# Patient Record
Sex: Female | Born: 1970 | Race: Black or African American | Hispanic: No | Marital: Married | Smoking: Current every day smoker
Health system: Southern US, Community
[De-identification: ages and names within clinical notes are randomized; demographics above are authoritative.]

## PROBLEM LIST (undated history)

## (undated) DIAGNOSIS — M549 Dorsalgia, unspecified: Secondary | ICD-10-CM

## (undated) HISTORY — PX: OTHER SURGICAL HISTORY: SHX169

## (undated) HISTORY — PX: KNEE SURGERY: SHX244

## (undated) HISTORY — PX: TOE SURGERY: SHX1073

## (undated) HISTORY — PX: FINGER SURGERY: SHX640

---

## 2015-12-20 ENCOUNTER — Ambulatory Visit (HOSPITAL_COMMUNITY): Payer: Medicaid Other | Attending: Internal Medicine

## 2015-12-20 ENCOUNTER — Encounter (HOSPITAL_COMMUNITY): Payer: Self-pay | Admitting: Emergency Medicine

## 2015-12-20 ENCOUNTER — Emergency Department (HOSPITAL_COMMUNITY)
Admission: EM | Admit: 2015-12-20 | Discharge: 2015-12-20 | Disposition: A | Discharge status: Home / Self Care | Payer: Medicaid Other | Attending: Emergency Medicine | Admitting: Emergency Medicine

## 2015-12-20 DIAGNOSIS — M549 Dorsalgia, unspecified: Secondary | ICD-10-CM | POA: Diagnosis present

## 2015-12-20 DIAGNOSIS — F17210 Nicotine dependence, cigarettes, uncomplicated: Secondary | ICD-10-CM | POA: Diagnosis not present

## 2015-12-20 DIAGNOSIS — R2689 Other abnormalities of gait and mobility: Secondary | ICD-10-CM | POA: Insufficient documentation

## 2015-12-20 DIAGNOSIS — M5441 Lumbago with sciatica, right side: Secondary | ICD-10-CM | POA: Insufficient documentation

## 2015-12-20 DIAGNOSIS — M5431 Sciatica, right side: Secondary | ICD-10-CM

## 2015-12-20 DIAGNOSIS — M62830 Muscle spasm of back: Secondary | ICD-10-CM | POA: Insufficient documentation

## 2015-12-20 HISTORY — DX: Dorsalgia, unspecified: M54.9

## 2015-12-20 MED ORDER — DEXAMETHASONE SODIUM PHOSPHATE 10 MG/ML IJ SOLN
10.0000 mg | Freq: Once | INTRAMUSCULAR | Status: AC
  Administered 2015-12-20: 10 mg via INTRAMUSCULAR
  Filled 2015-12-20: qty 1

## 2015-12-20 MED ORDER — HYDROCODONE-ACETAMINOPHEN 5-325 MG PO TABS: 1.0000 | ORAL_TABLET | Freq: Four times a day (QID) | ORAL | Status: DC | PRN

## 2015-12-20 MED ORDER — KETOROLAC TROMETHAMINE 60 MG/2ML IM SOLN
60.0000 mg | Freq: Once | INTRAMUSCULAR | Status: AC
  Administered 2015-12-20: 60 mg via INTRAMUSCULAR
  Filled 2015-12-20: qty 2

## 2015-12-20 MED ORDER — OXYCODONE-ACETAMINOPHEN 5-325 MG PO TABS
2.0000 | ORAL_TABLET | Freq: Once | ORAL | Status: AC
Start: 2015-12-20 — End: 2015-12-20
  Administered 2015-12-20: 2 via ORAL
  Filled 2015-12-20: qty 2

## 2015-12-20 MED ORDER — ONDANSETRON HCL 4 MG PO TABS
4.0000 mg | ORAL_TABLET | Freq: Once | ORAL | Status: AC
  Administered 2015-12-20: 4 mg via ORAL
  Filled 2015-12-20: qty 1

## 2015-12-20 NOTE — ED Provider Notes (Signed)
CSN: [id number]     Arrival date & time 12/20/15  1042 History   First MD Initiated Contact with Patient 12/20/15 1059     Chief Complaint  Patient presents with  . Back Pain     (Consider location/radiation/quality/duration/timing/severity/associated sxs/prior Treatment) HPI   45 year old female who presents emergency probably chief complaint of right lower back and leg pain. She has a past medical history of chronic back pain. Patient was at a rehabilitation center getting physical therapy this morning. She came to the emergency department for evaluation as severe, shooting pain down the back part of her right leg. She states she has a history of back pain and aching. However, this is the worst pain. She's ever had. She states that it is shooting from the back all the way down to behind her knee. It is worse when standing upright. She is pain with ambulation. She is currently taking Lyrica for pain control. She denies any recent injuries.Denies weakness, loss of bowel/bladder function or saddle anesthesia. Denies neck stiffness, headache, rash.  Denies fever or recent procedures to back.   Past Medical History  Diagnosis Date  . Back pain    Past Surgical History  Procedure Laterality Date  . Toe surgery    . Knee surgery    . Finger surgery     No family history on file. Social History  Substance Use Topics  . Smoking status: Current Every Day Smoker -- 0.50 packs/day    Types: Cigarettes  . Smokeless tobacco: None  . Alcohol Use: No   OB History    No data available     Review of Systems  Ten systems reviewed and are negative for acute change, except as noted in the HPI.    Allergies  Penicillins  Home Medications   Prior to Admission medications   Medication Sig Start Date End Date Taking? Authorizing Provider  HYDROcodone-acetaminophen (NORCO) 5-325 MG tablet Take 1-2 tablets by mouth every 6 (six) hours as needed for moderate pain. 12/20/15   Lilyonna Steidle,  PA-C   BP 160/86 mmHg  Pulse 72  Temp(Src) 98.2 F (36.8 C) (Oral)  Resp 18  Ht 5\' 9"  (1.753 m)  Wt 101.606 kg  BMI 33.06 kg/m2  SpO2 100%  LMP 11/26/2015 Physical Exam  Constitutional: She is oriented to person, place, and time. She appears well-developed and well-nourished. No distress.  HENT:  Head: Normocephalic and atraumatic.  Eyes: Conjunctivae are normal. No scleral icterus.  Neck: Normal range of motion.  Cardiovascular: Normal rate, regular rhythm and normal heart sounds.  Exam reveals no gallop and no friction rub.   No murmur heard. Pulmonary/Chest: Effort normal and breath sounds normal. No respiratory distress.  Abdominal: Soft. Bowel sounds are normal. She exhibits no distension and no mass. There is no tenderness. There is no guarding.  Musculoskeletal:  7  Patient appears to be in mild to moderate pain, antalgic gait noted. Lumbosacral spine area reveals no local tenderness or mass.  Painful and reduced LS ROM noted. Straight leg raise is positive at 10 degrees on right. DTR's, motor strength and sensation normal, including heel and toe gait.  Peripheral pulses are palpable. X-Ray: not indicated.     Neurological: She is alert and oriented to person, place, and time.  Skin: Skin is warm and dry. She is not diaphoretic.    ED Course  Procedures (including critical care time) Labs Review Labs Reviewed - No data to display  Imaging Review No results found.  I have personally reviewed and evaluated these images and lab results as part of my medical decision-making.   EKG Interpretation None      MDM   Final diagnoses:  Sciatica of right side    Patient with back pain.  No neurological deficits and normal neuro exam.  Patient can walk but states is painful.  No loss of bowel or bladder control.  No concern for cauda equina.  No fever, night sweats, weight loss, h/o cancer, IVDU.  RICE protocol and pain medicine indicated and discussed with patient.       Arthor Captainbigail Caidance Sybert, PA-C 12/20/15 1721  Glynn OctaveStephen Rancour, MD 12/20/15 [phone]

## 2015-12-20 NOTE — ED Notes (Signed)
Social worker contacted about transportation home. Social worker explained that patient could call [facility] to get home as she has Medicaid.

## 2015-12-20 NOTE — ED Notes (Signed)
Pt c/o RT lower back pain that radiates to RT leg. Denies recent injury or fall. States she was at rehab today for back/leg and was unable to complete it due to pain. Denies GI/GU symptoms.

## 2015-12-20 NOTE — Care Management (Signed)
CM consult received for transportation issues. Pt has medicaid and instructed to contact [facility] for transport home.

## 2015-12-20 NOTE — ED Notes (Signed)
Pt able to find a ride. Case Management aware.
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
## 2015-12-20 NOTE — Discharge Instructions (Signed)
SEEK IMMEDIATE MEDICAL ATTENTION IF: °New numbness, tingling, weakness, or problem with the use of your arms or legs.  °Severe back pain not relieved with medications.  °Change in bowel or bladder control.  °Increasing pain in any areas of the body (such as chest or abdominal pain).  °Shortness of breath, dizziness or fainting.  °Nausea (feeling sick to your stomach), vomiting, fever, or sweats. ° ° °Sciatica °Sciatica is pain, weakness, numbness, or tingling along the path of the sciatic nerve. The nerve starts in the lower back and runs down the back of each leg. The nerve controls the muscles in the lower leg and in the back of the knee, while also providing sensation to the back of the thigh, lower leg, and the sole of your foot. Sciatica is a symptom of another medical condition. For instance, nerve damage or certain conditions, such as a herniated disk or bone spur on the spine, pinch or put pressure on the sciatic nerve. This causes the pain, weakness, or other sensations normally associated with sciatica. Generally, sciatica only affects one side of the body. °CAUSES  °· Herniated or slipped disc. °· Degenerative disk disease. °· A pain disorder involving the narrow muscle in the buttocks (piriformis syndrome). °· Pelvic injury or fracture. °· Pregnancy. °· Tumor (rare). °SYMPTOMS  °Symptoms can vary from mild to very severe. The symptoms usually travel from the low back to the buttocks and down the back of the leg. Symptoms can include: °· Mild tingling or dull aches in the lower back, leg, or hip. °· Numbness in the back of the calf or sole of the foot. °· Burning sensations in the lower back, leg, or hip. °· Sharp pains in the lower back, leg, or hip. °· Leg weakness. °· Severe back pain inhibiting movement. °These symptoms may get worse with coughing, sneezing, laughing, or prolonged sitting or standing. Also, being overweight may worsen symptoms. °DIAGNOSIS  °Your caregiver will perform a physical exam  to look for common symptoms of sciatica. He or she may ask you to do certain movements or activities that would trigger sciatic nerve pain. Other tests may be performed to find the cause of the sciatica. These may include: °· Blood tests. °· X-rays. °· Imaging tests, such as an MRI or CT scan. °TREATMENT  °Treatment is directed at the cause of the sciatic pain. Sometimes, treatment is not necessary and the pain and discomfort goes away on its own. If treatment is needed, your caregiver may suggest: °· Over-the-counter medicines to relieve pain. °· Prescription medicines, such as anti-inflammatory medicine, muscle relaxants, or narcotics. °· Applying heat or ice to the painful area. °· Steroid injections to lessen pain, irritation, and inflammation around the nerve. °· Reducing activity during periods of pain. °· Exercising and stretching to strengthen your abdomen and improve flexibility of your spine. Your caregiver may suggest losing weight if the extra weight makes the back pain worse. °· Physical therapy. °· Surgery to eliminate what is pressing or pinching the nerve, such as a bone spur or part of a herniated disk. °HOME CARE INSTRUCTIONS  °· Only take over-the-counter or prescription medicines for pain or discomfort as directed by your caregiver. °· Apply ice to the affected area for 20 minutes, 3-4 times a day for the first 48-72 hours. Then try heat in the same way. °· Exercise, stretch, or perform your usual activities if these do not aggravate your pain. °· Attend physical therapy sessions as directed by your caregiver. °· Keep   all follow-up appointments as directed by your caregiver. °· Do not wear high heels or shoes that do not provide proper support. °· Check your mattress to see if it is too soft. A firm mattress may lessen your pain and discomfort. °SEEK IMMEDIATE MEDICAL CARE IF:  °· You lose control of your bowel or bladder (incontinence). °· You have increasing weakness in the lower back, pelvis,  buttocks, or legs. °· You have redness or swelling of your back. °· You have a burning sensation when you urinate. °· You have pain that gets worse when you lie down or awakens you at night. °· Your pain is worse than you have experienced in the past. °· Your pain is lasting longer than 4 weeks. °· You are suddenly losing weight without reason. °MAKE SURE YOU: °· Understand these instructions. °· Will watch your condition. °· Will get help right away if you are not doing well or get worse. °  °This information is not intended to replace advice given to you by your health care provider. Make sure you discuss any questions you have with your health care provider. °  °Document Released: 06/04/2001 Document Revised: 03/01/2015 Document Reviewed: 10/20/2011 °Elsevier Interactive Patient Education ©2016 [employer] Inc. ° °

## 2015-12-20 NOTE — ED Notes (Signed)
Patient is tearful in room. She states that she does not have a way back home. Patient states transportation service that dropped off patient is unable to wait for patient to be seen and can not wait to transport patient home.

## 2015-12-31 ENCOUNTER — Encounter (HOSPITAL_COMMUNITY): Payer: Self-pay | Admitting: Emergency Medicine

## 2015-12-31 ENCOUNTER — Emergency Department (HOSPITAL_COMMUNITY)
Admission: EM | Admit: 2015-12-31 | Discharge: 2015-12-31 | Disposition: A | Discharge status: Home / Self Care | Payer: Medicaid Other | Attending: Emergency Medicine | Admitting: Emergency Medicine

## 2015-12-31 DIAGNOSIS — M545 Low back pain: Secondary | ICD-10-CM | POA: Diagnosis present

## 2015-12-31 DIAGNOSIS — F17210 Nicotine dependence, cigarettes, uncomplicated: Secondary | ICD-10-CM | POA: Insufficient documentation

## 2015-12-31 DIAGNOSIS — M5440 Lumbago with sciatica, unspecified side: Secondary | ICD-10-CM | POA: Diagnosis not present

## 2015-12-31 MED ORDER — OXYCODONE-ACETAMINOPHEN 5-325 MG PO TABS: 2.0000 | ORAL_TABLET | ORAL | Status: DC | PRN

## 2015-12-31 MED ORDER — OXYCODONE-ACETAMINOPHEN 5-325 MG PO TABS
2.0000 | ORAL_TABLET | Freq: Once | ORAL | Status: AC
  Administered 2015-12-31: 2 via ORAL
  Filled 2015-12-31: qty 2

## 2015-12-31 MED ORDER — ONDANSETRON 4 MG PO TBDP
4.0000 mg | ORAL_TABLET | Freq: Once | ORAL | Status: AC
  Administered 2015-12-31: 4 mg via ORAL
  Filled 2015-12-31: qty 1

## 2015-12-31 MED ORDER — KETOROLAC TROMETHAMINE 60 MG/2ML IM SOLN
60.0000 mg | Freq: Once | INTRAMUSCULAR | Status: AC
  Administered 2015-12-31: 60 mg via INTRAMUSCULAR
  Filled 2015-12-31: qty 2

## 2015-12-31 MED ORDER — OXYCODONE-ACETAMINOPHEN 5-325 MG PO TABS: 2.0000 | ORAL_TABLET | ORAL | Status: AC | PRN

## 2015-12-31 NOTE — Discharge Instructions (Signed)

## 2015-12-31 NOTE — ED Provider Notes (Signed)
CSN: [id number]     Arrival date & time 12/31/15  1159 History  By signing my name below, I, Tanda Rockers, attest that this documentation has been prepared under the direction and in the presence of Langston Masker, [place].  Electronically Signed: Tanda Rockers, ED Scribe. 12/31/2015. 1:47 PM.   Chief Complaint  Patient presents with  . Back Pain   Patient is a 45 y.o. female presenting with back pain. The history is provided by the patient. No language interpreter was used.  Back Pain Location:  Lumbar spine Quality:  Shooting Radiates to:  R posterior upper leg Pain severity:  Severe Pain is:  Same all the time Onset quality:  Sudden Duration:  2 weeks Timing:  Constant Progression:  Worsening Chronicity:  New Context: not recent injury   Worsened by:  Movement Ineffective treatments:  Narcotics Associated symptoms: no bladder incontinence, no bowel incontinence, no numbness, no paresthesias, no perianal numbness, no tingling and no weakness     HPI Comments: Brianna Moyer is a 45 y.o. female with PMHx chronic back pain who presents to the Emergency Department complaining of sudden onset, constant, waxing and waning, lower back pain radiating down right lower leg that began this morning. Friend mentions that pt was at church this morning when she began having worsening pain to the back. She has been having this same pain for the past 2 weeks and was seen in the ED for same symptoms and is in the process of scheduling an MRI and is supposed to here back from PCP tomorrow. No known injury to the back. Denies abdominal pain, urinary or bowel incontinence, saddle anesthesia, weakness, numbness, tingling, or any other associated symptoms. Pt states that her blood glucose has been well controlled.   Per chart review: Pt was seen in ED on 12/20/2015 (approximately 12 days ago) for right lower back pain and right leg pain. Pt was getting PT done when she began having sudden onset, severe, shooting  back pain radiating down her right leg. While in the ED she was given a Toradol injection, Percocet, and Zofran. Pt was diagnosed with sciatica, given 20 tablets of Norco, and advised to follow up with PCP.   Past Medical History  Diagnosis Date  . Back pain    Past Surgical History  Procedure Laterality Date  . Toe surgery    . Knee surgery    . Finger surgery     History reviewed. No pertinent family history. Social History  Substance Use Topics  . Smoking status: Current Every Day Smoker -- 0.50 packs/day    Types: Cigarettes  . Smokeless tobacco: Never Used  . Alcohol Use: No   OB History    Gravida Para Term Preterm AB TAB SAB Ectopic Multiple Living   2 2 0 2      2     Review of Systems  Gastrointestinal: Negative for bowel incontinence.  Genitourinary: Negative for bladder incontinence.  Musculoskeletal: Positive for back pain and arthralgias.  Neurological: Negative for tingling, weakness, numbness and paresthesias.  All other systems reviewed and are negative.  Allergies  Penicillins  Home Medications   Prior to Admission medications   Medication Sig Start Date End Date Taking? Authorizing Provider  HYDROcodone-acetaminophen (NORCO) 5-325 MG tablet Take 1-2 tablets by mouth every 6 (six) hours as needed for moderate pain. 12/20/15   Abigail Harris, PA-C   BP 137/80 mmHg  Pulse 77  Temp(Src) 97.5 F (36.4 C) (Oral)  Resp 18  Ht  5\' 9"  (1.753 m)  Wt 216 lb (97.977 kg)  BMI 31.88 kg/m2  SpO2 100%  LMP 11/26/2015   Physical Exam  Constitutional: She is oriented to person, place, and time. She appears well-developed and well-nourished. No distress.  Crying on exam  HENT:  Head: Normocephalic and atraumatic.  Eyes: Conjunctivae and EOM are normal.  Neck: Neck supple. No tracheal deviation present.  Cardiovascular: Normal rate.   Pulmonary/Chest: Effort normal. No respiratory distress.  Musculoskeletal: Normal range of motion. She exhibits no tenderness.   Neurological: She is alert and oriented to person, place, and time.  Skin: Skin is warm and dry.  Psychiatric: She has a normal mood and affect. Her behavior is normal.  Nursing note and vitals reviewed.   ED Course  Procedures (including critical care time)  DIAGNOSTIC STUDIES: Oxygen Saturation is 100% on RA, normal by my interpretation.    COORDINATION OF CARE: 1:42 PM-Discussed treatment plan with pt at bedside and pt agreed to plan.   Labs Review Labs Reviewed - No data to display  Imaging Review No results found. I have personally reviewed and evaluated these images and lab results as part of my medical decision-making.   EKG Interpretation None      MDM   Final diagnoses:  Midline low back pain with sciatica, sciatica laterality unspecified    An After Visit Summary was printed and given to the patient. Meds ordered this encounter  Medications  . metFORMIN (GLUCOPHAGE) 500 MG tablet    Sig: Take 500 mg by mouth 2 (two) times daily with a meal.  . DULoxetine (CYMBALTA) 20 MG capsule    Sig: Take 20 mg by mouth 2 (two) times daily.  . SUMATRIPTAN SUCCINATE PO    Sig: Take 1 tablet by mouth every 2 (two) hours as needed (migraines).  Marland Kitchen. oxyCODONE-acetaminophen (PERCOCET/ROXICET) 5-325 MG per tablet 2 tablet    Sig:   . ketorolac (TORADOL) injection 60 mg    Sig:   . ondansetron (ZOFRAN-ODT) disintegrating tablet 4 mg    Sig:   . DISCONTD: oxyCODONE-acetaminophen (PERCOCET/ROXICET) 5-325 MG tablet    Sig: Take 2 tablets by mouth every 4 (four) hours as needed for severe pain.    Dispense:  15 tablet    Refill:  0    Order Specific Question:  Supervising Provider    Answer:  MILLER, BRIAN [[id number]]  . oxyCODONE-acetaminophen (PERCOCET/ROXICET) 5-325 MG tablet    Sig: Take 2 tablets by mouth every 4 (four) hours as needed for severe pain.    Dispense:  15 tablet    Refill:  0    Order Specific Question:  Supervising Provider    Answer:  Eber HongMILLER, BRIAN [[id number]]      Lonia SkinnerLeslie K BrewertonSofia, PA-C 12/31/15 1605  Donnetta HutchingBrian Cook, MD 01/03/16 1756

## 2015-12-31 NOTE — ED Notes (Signed)
Patient c/o lower back pain that radiates. Patient has had back pain and is to have MRI but pain became progressively worse today while sitting in church. Patient was diagnosed with sciatica here and was given toradol injection, percocet, zofran and a prescription for Norco that did help relieve pain.

## 2016-01-12 ENCOUNTER — Emergency Department (HOSPITAL_COMMUNITY)
Admission: EM | Admit: 2016-01-12 | Discharge: 2016-01-12 | Disposition: A | Discharge status: Home / Self Care | Payer: Medicaid Other | Attending: Emergency Medicine | Admitting: Emergency Medicine

## 2016-01-12 ENCOUNTER — Encounter (HOSPITAL_COMMUNITY): Payer: Self-pay | Admitting: *Deleted

## 2016-01-12 DIAGNOSIS — G8929 Other chronic pain: Secondary | ICD-10-CM | POA: Diagnosis not present

## 2016-01-12 DIAGNOSIS — F17210 Nicotine dependence, cigarettes, uncomplicated: Secondary | ICD-10-CM | POA: Insufficient documentation

## 2016-01-12 DIAGNOSIS — M545 Low back pain, unspecified: Secondary | ICD-10-CM

## 2016-01-12 MED ORDER — HYDROCODONE-ACETAMINOPHEN 5-325 MG PO TABS: 1.0000 | ORAL_TABLET | ORAL | Status: DC | PRN

## 2016-01-12 MED ORDER — CYCLOBENZAPRINE HCL 10 MG PO TABS: 10.0000 mg | ORAL_TABLET | Freq: Two times a day (BID) | ORAL | Status: DC | PRN

## 2016-01-12 MED ORDER — OXYCODONE-ACETAMINOPHEN 5-325 MG PO TABS
1.0000 | ORAL_TABLET | Freq: Once | ORAL | Status: AC
  Administered 2016-01-12: 1 via ORAL
  Filled 2016-01-12: qty 1

## 2016-01-12 MED ORDER — KETOROLAC TROMETHAMINE 60 MG/2ML IM SOLN
30.0000 mg | Freq: Once | INTRAMUSCULAR | Status: AC
  Administered 2016-01-12: 30 mg via INTRAMUSCULAR
  Filled 2016-01-12: qty 2

## 2016-01-12 MED ORDER — CYCLOBENZAPRINE HCL 10 MG PO TABS
10.0000 mg | ORAL_TABLET | Freq: Once | ORAL | Status: AC
  Administered 2016-01-12: 10 mg via ORAL
  Filled 2016-01-12: qty 1

## 2016-01-12 NOTE — ED Notes (Signed)
Pt comes in by EMS for chronic back pain that has persisted the last year. Pt is scheduled to have a MRI done next week. However, her pain has increased and she comes here for pain control. Pt denies any injury.

## 2016-01-12 NOTE — ED Provider Notes (Signed)
CSN: [id number]     Arrival date & time 01/12/16  1404 History   First MD Initiated Contact with Patient 01/12/16 1413     Chief Complaint  Patient presents with  . Back Pain     (Consider location/radiation/quality/duration/timing/severity/associated sxs/prior Treatment) HPI Brianna Moyer is a 45 y.o. female who presents to the ED via EMS with low back pain. She has a hx of chronic low back pain and is scheduled to have a f/u appointment with her PCP and arrange MRI next week. Patient reports that her pain has increased and she is here today for pain management. This is the same pain that has been persistent for the past year. She is out of her pain medication. Patient walks with a cane.   Past Medical History  Diagnosis Date  . Back pain    Past Surgical History  Procedure Laterality Date  . Toe surgery    . Knee surgery    . Finger surgery     No family history on file. Social History  Substance Use Topics  . Smoking status: Current Every Day Smoker -- 0.25 packs/day    Types: Cigarettes  . Smokeless tobacco: Never Used  . Alcohol Use: No   OB History    Gravida Para Term Preterm AB TAB SAB Ectopic Multiple Living   2 2 0 2      2     Review of Systems Negative except as stated in HPI   Allergies  Penicillins  Home Medications   Prior to Admission medications   Medication Sig Start Date End Date Taking? Authorizing Provider  cyclobenzaprine (FLEXERIL) 10 MG tablet Take 1 tablet (10 mg total) by mouth 2 (two) times daily as needed for muscle spasms. 01/12/16   Imer Foxworth Orlene Och, NP  DULoxetine (CYMBALTA) 20 MG capsule Take 20 mg by mouth 2 (two) times daily.    Historical Provider, MD  HYDROcodone-acetaminophen (NORCO/VICODIN) 5-325 MG tablet Take 1-2 tablets by mouth every 4 (four) hours as needed. 01/12/16   Trystan Eads Orlene Och, NP  metFORMIN (GLUCOPHAGE) 500 MG tablet Take 500 mg by mouth 2 (two) times daily with a meal.    Historical Provider, MD  oxyCODONE-acetaminophen  (PERCOCET/ROXICET) 5-325 MG tablet Take 2 tablets by mouth every 4 (four) hours as needed for severe pain. 12/31/15   Elson Areas, PA-C  SUMATRIPTAN SUCCINATE PO Take 1 tablet by mouth every 2 (two) hours as needed (migraines).    Historical Provider, MD   BP 110/88 mmHg  Pulse 74  Temp(Src) 97.8 F (36.6 C) (Oral)  Resp 20  Ht  (1.753 m)  Wt 100.245 kg  BMI 32.62 kg/m2  SpO2 99%  LMP 11/26/2015 Physical Exam  Constitutional: She is oriented to person, place, and time. She appears well-developed and well-nourished. No distress.  HENT:  Head: Normocephalic and atraumatic.  Right Ear: Tympanic membrane normal.  Left Ear: Tympanic membrane normal.  Mouth/Throat: Uvula is midline and mucous membranes are normal.  Eyes: Conjunctivae and EOM are normal.  Neck: Normal range of motion. Neck supple.  Cardiovascular: Normal rate and regular rhythm.   Pulmonary/Chest: Effort normal and breath sounds normal. No respiratory distress.  Abdominal: Soft. There is no tenderness.  Musculoskeletal:       Lumbar back: She exhibits tenderness, pain and spasm. She exhibits normal pulse.       Back:  Pedal pulses 2+, adequate circulation, plantar and dorsiflexion without difficulty.   Neurological: She is alert and oriented to  person, place, and time. She has normal strength. No cranial nerve deficit or sensory deficit. Gait normal.  Reflex Scores:      Bicep reflexes are 2+ on the right side and 2+ on the left side.      Brachioradialis reflexes are 2+ on the right side and 2+ on the left side.      Patellar reflexes are 2+ on the right side and 2+ on the left side. Steady gait without foot drag.  Skin: Skin is warm and dry.  Psychiatric: She has a normal mood and affect. Her behavior is normal.  Nursing note and vitals reviewed.   ED Course  Procedures (including critical care time) Labs Review Labs Reviewed - No data to display  MDM  45 y.o. female with acute flare of chronic low  back pain stable for d/c without focal neuro deficits and and no red flags to indicate need for immediate neuro consult or emergent MRI.  Discussed with the patient and all questioned fully answered.    Final diagnoses:  Acute exacerbation of chronic low back pain      [facility] M Delisa Finck, NP 01/12/16 1601  Bethann BerkshireJoseph Zammit, MD 01/13/16 1319

## 2016-01-12 NOTE — Discharge Instructions (Signed)
The narcotic and the muscle relaxant will make you sleepy. Follow up with your doctor as scheduled. Return here as needed.

## 2016-01-30 ENCOUNTER — Emergency Department (HOSPITAL_COMMUNITY)
Admission: EM | Admit: 2016-01-30 | Discharge: 2016-01-30 | Disposition: A | Discharge status: Home / Self Care | Payer: Medicaid Other | Attending: Emergency Medicine | Admitting: Emergency Medicine

## 2016-01-30 ENCOUNTER — Encounter (HOSPITAL_COMMUNITY): Payer: Self-pay | Admitting: Emergency Medicine

## 2016-01-30 DIAGNOSIS — Z7984 Long term (current) use of oral hypoglycemic drugs: Secondary | ICD-10-CM | POA: Insufficient documentation

## 2016-01-30 DIAGNOSIS — F17210 Nicotine dependence, cigarettes, uncomplicated: Secondary | ICD-10-CM | POA: Diagnosis not present

## 2016-01-30 DIAGNOSIS — M5431 Sciatica, right side: Secondary | ICD-10-CM | POA: Insufficient documentation

## 2016-01-30 DIAGNOSIS — M549 Dorsalgia, unspecified: Secondary | ICD-10-CM | POA: Diagnosis present

## 2016-01-30 DIAGNOSIS — Z79899 Other long term (current) drug therapy: Secondary | ICD-10-CM | POA: Insufficient documentation

## 2016-01-30 MED ORDER — CYCLOBENZAPRINE HCL 10 MG PO TABS: 10.0000 mg | ORAL_TABLET | Freq: Three times a day (TID) | ORAL | 0 refills | Status: AC | PRN

## 2016-01-30 MED ORDER — DICLOFENAC SODIUM 75 MG PO TBEC: 75.0000 mg | DELAYED_RELEASE_TABLET | Freq: Two times a day (BID) | ORAL | 0 refills | Status: AC

## 2016-01-30 MED ORDER — KETOROLAC TROMETHAMINE 60 MG/2ML IM SOLN
60.0000 mg | Freq: Once | INTRAMUSCULAR | Status: AC
  Administered 2016-01-30: 60 mg via INTRAMUSCULAR
  Filled 2016-01-30: qty 2

## 2016-01-30 NOTE — Discharge Instructions (Signed)
Follow-up with your primary doctor for recheck °

## 2016-01-30 NOTE — ED Triage Notes (Signed)
Patient complaining of back pain radiating down right leg starting this morning. States she has history of same. Denies injury.

## 2016-02-01 NOTE — ED Provider Notes (Signed)
[facility]-EMERGENCY DEPT Provider Note   CSN: [id number] Arrival date & time: 01/30/16  1122  First Provider Contact:  First MD Initiated Contact with Patient 01/30/16 1139        History   Chief Complaint Chief Complaint  Patient presents with  . Back Pain    HPI Brianna Moyer is a 45 y.o. female.  HPI  Brianna Moyer is a 45 y.o. female who presents to the Emergency Department complaining of worsening of her recurrent low back pain.  She describes a sharp, throbbing pain from her back that radiates into her right thigh.  Pain worse with weight bearing. She reports improvement with the hydrocodone previously prescribed.  She denies fever, numbness or weakness of the LE's, abd pain, urine or bowel changes.     Past Medical History:  Diagnosis Date  . Back pain     There are no active problems to display for this patient.   Past Surgical History:  Procedure Laterality Date  . FINGER SURGERY    . KNEE SURGERY    . TOE SURGERY      OB History    Gravida Para Term Preterm AB Living   2 2 0 2   2   SAB TAB Ectopic Multiple Live Births                   Home Medications    Prior to Admission medications   Medication Sig Start Date End Date Taking? Authorizing Provider  cyclobenzaprine (FLEXERIL) 10 MG tablet Take 1 tablet (10 mg total) by mouth 3 (three) times daily as needed. 01/30/16   Tommye Lehenbauer, PA-C  diclofenac (VOLTAREN) 75 MG EC tablet Take 1 tablet (75 mg total) by mouth 2 (two) times daily. Take with food 01/30/16   Bea Duren, PA-C  DULoxetine (CYMBALTA) 20 MG capsule Take 20 mg by mouth 2 (two) times daily.    Historical Provider, MD  HYDROcodone-acetaminophen (NORCO/VICODIN) 5-325 MG tablet Take 1-2 tablets by mouth every 4 (four) hours as needed. 01/12/16   Hope Orlene Och, NP  metFORMIN (GLUCOPHAGE) 500 MG tablet Take 500 mg by mouth 2 (two) times daily with a meal.    Historical Provider, MD  oxyCODONE-acetaminophen (PERCOCET/ROXICET) 5-325 MG tablet Take  2 tablets by mouth every 4 (four) hours as needed for severe pain. 12/31/15   Elson Areas, PA-C  SUMATRIPTAN SUCCINATE PO Take 1 tablet by mouth every 2 (two) hours as needed (migraines).    Historical Provider, MD    Family History History reviewed. No pertinent family history.  Social History Social History  Substance Use Topics  . Smoking status: Current Every Day Smoker    Packs/day: 0.25    Types: Cigarettes  . Smokeless tobacco: Never Used  . Alcohol use No     Allergies   Penicillins   Review of Systems Review of Systems  Constitutional: Negative for fever.  Respiratory: Negative for shortness of breath.   Gastrointestinal: Negative for abdominal pain, constipation and vomiting.  Genitourinary: Negative for decreased urine volume, difficulty urinating, dysuria, flank pain and hematuria.  Musculoskeletal: Positive for back pain. Negative for joint swelling.  Skin: Negative for rash.  Neurological: Negative for weakness and numbness.  All other systems reviewed and are negative.    Physical Exam Updated Vital Signs BP 102/89   Pulse 74   Temp 97.9 F (36.6 C)   Resp 18   Ht  (1.753 m)   Wt 99.8 kg   LMP  01/23/2016   SpO2 99%   BMI 32.49 kg/m   Physical Exam  Constitutional: She is oriented to person, place, and time. She appears well-developed and well-nourished. No distress.  HENT:  Head: Normocephalic and atraumatic.  Neck: Normal range of motion. Neck supple.  Cardiovascular: Normal rate, regular rhythm and intact distal pulses.   No murmur heard. Pulmonary/Chest: Effort normal and breath sounds normal. No respiratory distress.  Abdominal: Soft. She exhibits no distension. There is no tenderness.  Musculoskeletal: She exhibits tenderness. She exhibits no edema.       Lumbar back: She exhibits tenderness and pain. She exhibits normal range of motion, no swelling, no deformity, no laceration and normal pulse.  ttp of the right lumbar paraspinal  muscles and SI joint.  No spinal tenderness.  DP pulses are brisk and symmetrical.  Distal sensation intact.  Pt has 5/5 strength against resistance of bilateral lower extremities.     Neurological: She is alert and oriented to person, place, and time. She has normal strength. No sensory deficit. She exhibits normal muscle tone. Coordination and gait normal.  Reflex Scores:      Patellar reflexes are 2+ on the right side and 2+ on the left side.      Achilles reflexes are 2+ on the right side and 2+ on the left side. Skin: Skin is warm and dry. No rash noted.  Nursing note and vitals reviewed.    ED Treatments / Results  Labs (all labs ordered are listed, but only abnormal results are displayed) Labs Reviewed - No data to display  EKG  EKG Interpretation None       Radiology No results found.  Procedures Procedures (including critical care time)  Medications Ordered in ED Medications  ketorolac (TORADOL) injection 60 mg (60 mg Intramuscular Given 01/30/16 1227)     Initial Impression / Assessment and Plan / ED Course  I have reviewed the triage vital signs and the nursing notes.  Pertinent labs & imaging results that were available during my care of the patient were reviewed by me and considered in my medical decision making (see chart for details).  Clinical Course    Pt is well appearing.  Ambulates with a steady gait.  No focal neuro deficits. Pt reports PMD is arranging f/u with a "back specialist" she has been here on 2 other occasions for same.  I have discussed with the patient that further narcotics are not indicated.                                                                         Final Clinical Impressions(s) / ED Diagnoses   Final diagnoses:  Sciatica of right side    New Prescriptions Discharge Medication List as of 01/30/2016 12:32 PM    START taking these medications   Details  diclofenac (VOLTAREN) 75 MG EC tablet Take 1 tablet (75 mg total)  by mouth 2 (two) times daily. Take with food, Starting Tue 01/30/2016, Print         Saudia Smyser [place], PA-C 02/01/16 2056    Geoffery Lyonsouglas Delo, MD 02/05/16 [phone]

## 2016-05-28 ENCOUNTER — Ambulatory Visit: Payer: Medicaid Other | Admitting: Pain Medicine

## 2016-07-29 DIAGNOSIS — M5441 Lumbago with sciatica, right side: Secondary | ICD-10-CM

## 2016-07-29 DIAGNOSIS — G8929 Other chronic pain: Secondary | ICD-10-CM | POA: Insufficient documentation

## 2016-07-29 DIAGNOSIS — Z79891 Long term (current) use of opiate analgesic: Secondary | ICD-10-CM | POA: Insufficient documentation

## 2016-07-29 DIAGNOSIS — M533 Sacrococcygeal disorders, not elsewhere classified: Secondary | ICD-10-CM

## 2016-07-29 DIAGNOSIS — M5410 Radiculopathy, site unspecified: Secondary | ICD-10-CM

## 2016-07-29 DIAGNOSIS — M79604 Pain in right leg: Secondary | ICD-10-CM

## 2016-07-29 DIAGNOSIS — G894 Chronic pain syndrome: Secondary | ICD-10-CM | POA: Insufficient documentation

## 2016-07-29 DIAGNOSIS — F1190 Opioid use, unspecified, uncomplicated: Secondary | ICD-10-CM | POA: Insufficient documentation

## 2016-07-29 NOTE — Progress Notes (Deleted)
Patient's Name: Brianna Moyer  MRN: [id number]  Referring Provider: Abran Richard, MD  DOB: 10/29/1970  PCP: Abran Richard, MD  DOS: 07/30/2016  Note by: Kathlen Brunswick. Dossie Arbour, MD  Service setting: Ambulatory outpatient  Specialty: Interventional Pain Management  Location: [facility] (AMB) Pain Management Facility    Patient type: New Patient   Primary Reason(s) for Visit: Initial Patient Evaluation CC: No chief complaint on file.  HPI  Brianna Moyer is a 46 y.o. year old, female patient, who comes today for an initial evaluation. She has Chronic pain syndrome; Long term current use of opiate analgesic; Long term prescription opiate use; Opiate use; Chronic bilateral low back pain with right-sided sciatica; Chronic pain of right lower extremity; Chronic radicular pain of lower extremity; and Chronic left sacroiliac pain on her problem list.. Her primarily concern today is the No chief complaint on file.  Pain Assessment: Self-Reported Pain Score:  /10             Reported level is compatible with observation.          Onset and Duration: {Hx; Onset and Duration:210120511} Cause of pain: {Hx; Cause:210120521} Severity: {Pain Severity:210120502} Timing: {Symptoms; Timing:210120501} Aggravating Factors: {Causes; Aggravating pain factors:210120507} Alleviating Factors: {Causes; Alleviating Factors:210120500} Associated Problems: {Hx; Associated problems:210120515} Quality of Pain: {Hx; Symptom quality or Descriptor:210120531} Previous Examinations or Tests: {Hx; Previous examinations or test:210120529} Previous Treatments: {Hx; Previous Treatment:210120503}  The patient comes into the clinics today for the first time for a chronic pain management evaluation. ***  Today I took the time to provide the patient with information regarding my pain practice. The patient was informed that my practice is divided into two sections: an interventional pain management section, as well as a completely separate and  distinct medication management section. The interventional portion of my practice takes place on Tuesdays and Thursdays, while the medication management is conducted on Mondays and Wednesdays. Because of the amount of documentation required on both them, they are kept separated. This means that there is the possibility that the patient may be scheduled for a procedure on Tuesday, while also having a medication management appointment on Wednesday. I have also informed the patient that because of current staffing and facility limitations, I no longer take patients for medication management only. To illustrate the reasons for this, I gave the patient the example of a surgeon and how inappropriate it would be to refer a patient to his/her practice so that they write for the post-procedure antibiotics on a surgery done by someone else.   The patient was informed that joining my practice means that they are open to any and all interventional therapies. I clarified for the patient that this does not mean that they will be forced to have any procedures done. What it means is that patients looking for a practitioner to simply write for their pain medications and not take advantage of other interventional techniques will be better served by a different practitioner, other than myself. I made it clear that I prefer to spend my time providing those services that I specialize in.  The patient was also made aware of my Comprehensive Pain Management Safety Guidelines where by joining my practice, they limit all of their nerve blocks and joint injections to those done by our practice, for as long as we are retained to manage their care.   Historic Controlled Substance Pharmacotherapy Review  PMP and historical list of controlled substances: *** Highest analgesic regimen found: *** Most recent analgesic: *** Highest  recorded MME/day: *** mg/day MME/day: *** mg/day Medications: The patient did not bring the medication(s)  to the appointment, as requested in our "New Patient Package" Pharmacodynamics: Desired effects: Analgesia: The patient reports >50% benefit. Reported improvement in function: The patient reports medication allows her to accomplish basic ADLs. Clinically meaningful improvement in function (CMIF): Sustained CMIF goals met Perceived effectiveness: Described as relatively effective, allowing for increase in activities of daily living (ADL) Undesirable effects: Side-effects or Adverse reactions: None reported Historical Monitoring: The patient  reports that she does not use drugs.. No results found for: MDMA, COCAINSCRNUR, PCPSCRNUR, THCU, ETH Historical Background Evaluation: [place] PDMP: Six (6) year initial data search conducted.             [place] Department of public safety, offender search: Editor, commissioning Information) Non-contributory Risk Assessment Profile: Aberrant behavior: None observed or detected today Risk factors for fatal opioid overdose: None identified today Fatal overdose hazard ratio (HR): Calculation deferred Non-fatal overdose hazard ratio (HR): Calculation deferred Risk of opioid abuse or dependence: 0.7-3.0% with doses ? 36 MME/day and 6.1-26% with doses ? 120 MME/day. Substance use disorder (SUD) risk level: Pending results of Medical Psychology Evaluation for SUD Opioid risk tool (ORT) (Total Score):    ORT Scoring interpretation table:  Score <3 = Low Risk for SUD  Score between 4-7 = Moderate Risk for SUD  Score >8 = High Risk for Opioid Abuse   PHQ-2 Depression Scale:  Total score:    PHQ-2 Scoring interpretation table: (Score and probability of major depressive disorder)  Score 0 = No depression  Score 1 = 15.4% Probability  Score 2 = 21.1% Probability  Score 3 = 38.4% Probability  Score 4 = 45.5% Probability  Score 5 = 56.4% Probability  Score 6 = 78.6% Probability   PHQ-9 Depression Scale:  Total score:    PHQ-9 Scoring interpretation table:  Score 0-4 = No  depression  Score 5-9 = Mild depression  Score 10-14 = Moderate depression  Score 15-19 = Moderately severe depression  Score 20-27 = Severe depression (2.4 times higher risk of SUD and 2.89 times higher risk of overuse)   Pharmacologic Plan: Pending ordered tests and/or consults  Meds  The patient has a current medication list which includes the following prescription(s): cyclobenzaprine, diclofenac, duloxetine, hydrocodone-acetaminophen, metformin, oxycodone-acetaminophen, and sumatriptan succinate.  Current Outpatient Prescriptions on File Prior to Visit  Medication Sig  . cyclobenzaprine (FLEXERIL) 10 MG tablet Take 1 tablet (10 mg total) by mouth 3 (three) times daily as needed.  . diclofenac (VOLTAREN) 75 MG EC tablet Take 1 tablet (75 mg total) by mouth 2 (two) times daily. Take with food  . DULoxetine (CYMBALTA) 20 MG capsule Take 20 mg by mouth 2 (two) times daily.  Mar[place] Kitchen HYDROcodone-acetaminophen (NORCO/VICODIN) 5-325 MG tablet Take 1-2 tablets by mouth every 4 (four) hours as needed.  . metFORMIN (GLUCOPHAGE) 500 MG tablet Take 500 mg by mouth 2 (two) times daily with a meal.  . oxyCODONE-acetaminophen (PERCOCET/ROXICET) 5-325 MG tablet Take 2 tablets by mouth every 4 (four) hours as needed for severe pain.  . SUMATRIPTAN SUCCINATE PO Take 1 tablet by mouth every 2 (two) hours as needed (migraines).   No current facility-administered medications on file prior to visit.    Imaging Review  Cervical Imaging: Cervical MR wo contrast: No results found for this or any previous visit. Cervical MR wo contrast: No results found for this or any previous visit. Cervical MR w/wo contrast: No results found for this  or any previous visit. Cervical MR w contrast: No results found for this or any previous visit. Cervical CT wo contrast: No results found for this or any previous visit. Cervical CT w/wo contrast: No results found for this or any previous visit. Cervical CT w/wo contrast: No  results found for this or any previous visit. Cervical CT w contrast: No results found for this or any previous visit. Cervical CT outside: No results found for this or any previous visit. Cervical DG 1 view: No results found for this or any previous visit. Cervical DG 2-3 views: No results found for this or any previous visit. Cervical DG F/E views: No results found for this or any previous visit. Cervical DG 2-3 clearing views: No results found for this or any previous visit. Cervical DG Bending/F/E views: No results found for this or any previous visit. Cervical DG complete: No results found for this or any previous visit. Cervical DG Myelogram views: No results found for this or any previous visit. Cervical DG Myelogram views: No results found for this or any previous visit. Cervical Discogram views: No results found for this or any previous visit.  Shoulder Imaging: Shoulder-R MR w contrast: No results found for this or any previous visit. Shoulder-L MR w contrast: No results found for this or any previous visit. Shoulder-R MR w/wo contrast: No results found for this or any previous visit. Shoulder-L MR w/wo contrast: No results found for this or any previous visit. Shoulder-R MR wo contrast: No results found for this or any previous visit. Shoulder-L MR wo contrast: No results found for this or any previous visit. Shoulder-R CT w contrast: No results found for this or any previous visit. Shoulder-L CT w contrast: No results found for this or any previous visit. Shoulder-R CT w/wo contrast: No results found for this or any previous visit. Shoulder-L CT w/wo contrast: No results found for this or any previous visit. Shoulder-R CT wo contrast: No results found for this or any previous visit. Shoulder-L CT wo contrast: No results found for this or any previous visit. Shoulder-R DG Arthrogram: No results found for this or any previous visit. Shoulder-L DG Arthrogram: No results found for  this or any previous visit. Shoulder-R DG 1 view: No results found for this or any previous visit. Shoulder-L DG 1 view: No results found for this or any previous visit. Shoulder-R DG: No results found for this or any previous visit. Shoulder-L DG: No results found for this or any previous visit.  Thoracic Imaging: Thoracic MR wo contrast: No results found for this or any previous visit. Thoracic MR wo contrast: No results found for this or any previous visit. Thoracic MR w/wo contrast: No results found for this or any previous visit. Thoracic MR w contrast: No results found for this or any previous visit. Thoracic CT wo contrast: No results found for this or any previous visit. Thoracic CT w/wo contrast: No results found for this or any previous visit. Thoracic CT w/wo contrast: No results found for this or any previous visit. Thoracic CT w contrast: No results found for this or any previous visit. Thoracic DG 2-3 views: No results found for this or any previous visit. Thoracic DG 4 views: No results found for this or any previous visit. Thoracic DG: No results found for this or any previous visit. Thoracic DG w/swimmers view: No results found for this or any previous visit. Thoracic DG Myelogram views: No results found for this or any previous visit.  Thoracic DG Myelogram views: No results found for this or any previous visit.  Lumbosacral Imaging: Lumbar MR wo contrast: No results found for this or any previous visit. Lumbar MR wo contrast: No results found for this or any previous visit. Lumbar MR w/wo contrast: No results found for this or any previous visit. Lumbar MR w contrast: No results found for this or any previous visit. Lumbar CT wo contrast: No results found for this or any previous visit. Lumbar CT w/wo contrast: No results found for this or any previous visit. Lumbar CT w/wo contrast: No results found for this or any previous visit. Lumbar CT w contrast: No results  found for this or any previous visit. Lumbar DG 1V: No results found for this or any previous visit. Lumbar DG 1V (Clearing): No results found for this or any previous visit. Lumbar DG 2-3V (Clearing): No results found for this or any previous visit. Lumbar DG 2-3 views: No results found for this or any previous visit. Lumbar DG (Complete) 4+V: No results found for this or any previous visit. Lumbar DG F/E views: No results found for this or any previous visit. Lumbar DG Bending views: No results found for this or any previous visit. Lumbar DG Myelogram views: No results found for this or any previous visit. Lumbar DG Myelogram: No results found for this or any previous visit. Lumbar DG Myelogram: No results found for this or any previous visit. Lumbar DG Myelogram: No results found for this or any previous visit. Lumbar DG Myelogram Lumbosacral: No results found for this or any previous visit. Lumbar DG Diskogram views: No results found for this or any previous visit. Lumbar DG Diskogram views: No results found for this or any previous visit. Lumbar DG Epidurogram OP: No results found for this or any previous visit. Lumbar DG Epidurogram IP: No results found for this or any previous visit.  Sacroiliac Joint Imaging: Sacroiliac Joint DG: No results found for this or any previous visit. Sacroiliac Joint MR w/wo contrast: No results found for this or any previous visit. Sacroiliac Joint MR wo contrast: No results found for this or any previous visit.  Spine Imaging: Whole Spine DG Myelogram views: No results found for this or any previous visit. Whole Spine MR Mets screen: No results found for this or any previous visit. Whole Spine MR Mets screen: No results found for this or any previous visit. Whole Spine MR w/wo: No results found for this or any previous visit. MRA Spinal Canal w/ cm: No results found for this or any previous visit. MRA Spinal Canal wo/ cm: No results found for this or  any previous visit. MRA Spinal Canal w/wo cm: No results found for this or any previous visit. Spine Outside MR Films: No results found for this or any previous visit. Spine Outside CT Films: No results found for this or any previous visit. CT-Guided Biopsy: No results found for this or any previous visit. CT-Guided Needle Placement: No results found for this or any previous visit. DG Spine outside: No results found for this or any previous visit. IR Spine outside: No results found for this or any previous visit. NM Spine outside: No results found for this or any previous visit.  Hip Imaging: Hip-R MR w contrast: No results found for this or any previous visit. Hip-L MR w contrast: No results found for this or any previous visit. Hip-R MR w/wo contrast: No results found for this or any previous visit. Hip-L MR w/wo  contrast: No results found for this or any previous visit. Hip-R MR wo contrast: No results found for this or any previous visit. Hip-L MR wo contrast: No results found for this or any previous visit. Hip-R CT w contrast: No results found for this or any previous visit. Hip-L CT w contrast: No results found for this or any previous visit. Hip-R CT w/wo contrast: No results found for this or any previous visit. Hip-L CT w/wo contrast: No results found for this or any previous visit. Hip-R CT wo contrast: No results found for this or any previous visit. Hip-L CT wo contrast: No results found for this or any previous visit. Hip-R DG 2-3 views: No results found for this or any previous visit. Hip-L DG 2-3 views: No results found for this or any previous visit. Hip-R DG Arthrogram: No results found for this or any previous visit. Hip-L DG Arthrogram: No results found for this or any previous visit. Hip-B DG Bilateral: No results found for this or any previous visit.  Knee Imaging: Knee-R MR w contrast: No results found for this or any previous visit. Knee-L MR w contrast: No  results found for this or any previous visit. Knee-R MR w/wo contrast: No results found for this or any previous visit. Knee-L MR w/wo contrast: No results found for this or any previous visit. Knee-R MR wo contrast: No results found for this or any previous visit. Knee-L MR wo contrast: No results found for this or any previous visit. Knee-R CT w contrast: No results found for this or any previous visit. Knee-L CT w contrast: No results found for this or any previous visit. Knee-R CT w/wo contrast: No results found for this or any previous visit. Knee-L CT w/wo contrast: No results found for this or any previous visit. Knee-R CT wo contrast: No results found for this or any previous visit. Knee-L CT wo contrast: No results found for this or any previous visit. Knee-R DG 1-2 views: No results found for this or any previous visit. Knee-L DG 1-2 views: No results found for this or any previous visit. Knee-R DG 3 views: No results found for this or any previous visit. Knee-L DG 3 views: No results found for this or any previous visit. Knee-R DG 4 views: No results found for this or any previous visit. Knee-L DG 4 views: No results found for this or any previous visit. Knee-R DG Arthrogram: No results found for this or any previous visit. Knee-L DG Arthrogram: No results found for this or any previous visit.  Note: Available results from prior imaging studies were reviewed.        ROS  Cardiovascular History: {Hx; Cardiovascular History:210120525} Pulmonary or Respiratory History: {Hx; Pumonary and/or Respiratory History:210120523} Neurological History: {Hx; Neurological:210120504} Review of Past Neurological Studies: No results found for this or any previous visit. Psychological-Psychiatric History: {Hx; Psychological-Psychiatric History:210120512} Gastrointestinal History: {Hx; Gastrointestinal:210120527} Genitourinary History: {Hx; Genitourinary:210120506} Hematological History: {Hx;  Hematological:210120510} Endocrine History: {Hx; Endocrine history:210120509} Rheumatologic History: {Hx; Rheumatological:210120530} Musculoskeletal History: {Hx; Musculoskeletal:210120528} Work History: {Hx; Work history:210120514}  Allergies  Brianna Moyer is allergic to penicillins.  Laboratory Chemistry  Inflammation Markers No results found for: ESRSEDRATE, CRP Renal Function No results found for: BUN, CREATININE, GFRAA, GFRNONAA Hepatic Function No results found for: AST, ALT, ALBUMIN Electrolytes No results found for: NA, K, CL, CALCIUM, MG Pain Modulating Vitamins No results found for: [place], [id number], [id number], [id number], 25OHVITD1, 25OHVITD2, 25OHVITD3, VITAMINB12 Coagulation Parameters No results found for: INR, LABPROT, APTT, PLT  Cardiovascular No results found for: BNP, HGB, HCT Note: Lab results reviewed.  [place]  Drug: Brianna Moyer  reports that she does not use drugs. Alcohol:  reports that she does not drink alcohol. Tobacco:  reports that she has been smoking Cigarettes.  She has been smoking about 0.25 packs per day. She has never used smokeless tobacco. Medical:  has a past medical history of Back pain. Family: family history is not on file.  Past Surgical History:  Procedure Laterality Date  . FINGER SURGERY    . KNEE SURGERY    . TOE SURGERY     Active Ambulatory Problems    Diagnosis Date Noted  . Chronic pain syndrome 07/29/2016  . Long term current use of opiate analgesic 07/29/2016  . Long term prescription opiate use 07/29/2016  . Opiate use 07/29/2016  . 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 Chronic pain of right lower extremity 07/29/2016  . Chronic radicular pain of lower extremity 07/29/2016  . Chronic left sacroiliac pain 07/29/2016   Resolved Ambulatory Problems    Diagnosis Date Noted  . No Resolved Ambulatory Problems   Past Medical History:  Diagnosis Date  . Back pain    Constitutional  Exam  General appearance: Well nourished, well developed, and well hydrated. In no apparent acute distress There were no vitals filed for this visit. BMI Assessment: Estimated body mass index is 32.49 kg/m as calculated from the following:   Height as of 01/30/16: _0  (1.753 m).   Weight as of 01/30/16: 220 lb (99.8 kg).  BMI interpretation table: BMI level Category Range association with higher incidence of chronic pain  <18 kg/m2 Underweight   18.5-24.9 kg/m2 Ideal body weight   25-29.9 kg/m2 Overweight Increased incidence by 20%  30-34.9 kg/m2 Obese (Class I) Increased incidence by 68%  35-39.9 kg/m2 Severe obesity (Class II) Increased incidence by 136%  >40 kg/m2 Extreme obesity (Class III) Increased incidence by 254%   BMI Readings from Last 4 Encounters:  01/30/16 32.49 kg/m  01/12/16 32.64 kg/m  12/31/15 31.90 kg/m  12/20/15 33.08 kg/m   Wt Readings from Last 4 Encounters:  01/30/16 220 lb (99.8 kg)  01/12/16 221 lb (100.2 kg)  12/31/15 216 lb (98 kg)  12/20/15 224 lb (101.6 kg)  Psych/Mental status: Alert, oriented x 3 (person, place, & time)       Eyes: PERLA Respiratory: No evidence of acute respiratory distress  Cervical Spine Exam  Inspection: No masses, redness, or swelling Alignment: Symmetrical Functional ROM: Unrestricted ROM Stability: No instability detected Muscle strength & Tone: Functionally intact Sensory: Unimpaired Palpation: Non-contributory  Upper Extremity (UE) Exam    Side: Right upper extremity  Side: Left upper extremity  Inspection: No masses, redness, swelling, or asymmetry. No contractures  Inspection: No masses, redness, swelling, or asymmetry. No contractures  Functional ROM: Unrestricted ROM          Functional ROM: Unrestricted ROM          Muscle strength & Tone: Functionally intact  Muscle strength & Tone: Functionally intact  Sensory: Unimpaired  Sensory: Unimpaired  Palpation: Euthermic  Palpation: Euthermic  Specialized  Test(s): Deferred         Specialized Test(s): Deferred          Thoracic Spine Exam  Inspection: No masses, redness, or swelling Alignment: Symmetrical Functional ROM: Unrestricted ROM Stability: No instability detected Sensory: Unimpaired Muscle strength & Tone: Functionally intact Palpation: Non-contributory  Lumbar Spine Exam  Inspection: No masses, redness, or swelling Alignment: Symmetrical Functional ROM: Unrestricted ROM Stability: No instability detected Muscle strength & Tone: Functionally intact Sensory: Unimpaired Palpation: Non-contributory Provocative Tests: Lumbar Hyperextension and rotation test: evaluation deferred today       Patrick's Maneuver: evaluation deferred today              Gait & Posture Assessment  Ambulation: Unassisted Gait: Relatively normal for age and body habitus Posture: WNL   Lower Extremity Exam    Side: Right lower extremity  Side: Left lower extremity  Inspection: No masses, redness, swelling, or asymmetry. No contractures  Inspection: No masses, redness, swelling, or asymmetry. No contractures  Functional ROM: Unrestricted ROM          Functional ROM: Unrestricted ROM          Muscle strength & Tone: Functionally intact  Muscle strength & Tone: Functionally intact  Sensory: Unimpaired  Sensory: Unimpaired  Palpation: No palpable anomalies  Palpation: No palpable anomalies   Assessment  Primary Diagnosis & Pertinent Problem List: The primary encounter diagnosis was Chronic pain syndrome. Diagnoses of Long term current use of opiate analgesic, Long term prescription opiate use, Opiate use, Chronic bilateral low back pain with right-sided sciatica, Chronic pain of right lower extremity, Chronic radicular pain of lower extremity, and Chronic left sacroiliac pain were also pertinent to this visit.  Visit Diagnosis: 1. Chronic pain syndrome   2. Long term current use of opiate analgesic   3. Long term prescription opiate use   4. Opiate  use   5. Chronic bilateral low back pain with right-sided sciatica   6. Chronic pain of right lower extremity   7. Chronic radicular pain of lower extremity   8. Chronic left sacroiliac pain    Plan of Care  Initial treatment plan:  Please be advised that as per protocol, today's visit has been an evaluation only. We have not taken over the patient's controlled substance management.  Problem-specific plan: No problem-specific Assessment & Plan notes found for this encounter.  Ordered Lab-work, Procedure(s), Referral(s), & Consult(s): No orders of the defined types were placed in this encounter.  Pharmacotherapy: Medications ordered:  No orders of the defined types were placed in this encounter.  Medications administered during this visit: Brianna Moyer had no medications administered during this visit.   Pharmacotherapy under consideration:  Opioid Analgesics: The patient was informed that there is no guarantee that she would be a candidate for opioid analgesics. The decision will be made following CDC guidelines. This decision will be based on the results of diagnostic studies, as well as Brianna Moyer's risk profile.  Membrane stabilizer: To be determined at a later time Muscle relaxant: To be determined at a later time NSAID: To be determined at a later time Other analgesic(s): To be determined at a later time   Interventional therapies under consideration: Brianna Moyer was informed that there is no guarantee that she would be a candidate for interventional therapies. The decision will be based on the results of diagnostic studies, as well as Brianna Moyer's risk profile.  Possible procedure(s): ***   Provider-requested follow-up: No Follow-up on file.  Future Appointments Date Time Provider [place]  07/30/2016 9:15 AM Milinda Pointer, MD [facility] None    Primary Care Physician: Abran Richard, MD Location: [facility] Note by: Pryor Guettler  A. Dossie Arbour, M.D, DABA, DABAPM, DABPM, DABIPP, FIPP Date: 07/30/2016; Time: 9:19 PM  Pain Score Disclaimer: We use the NRS-11 scale. This  is a self-reported, subjective measurement of pain severity with only modest accuracy. It is used primarily to identify changes within a particular patient. It must be understood that outpatient pain scales are significantly less accurate that those used for research, where they can be applied under ideal controlled circumstances with minimal exposure to variables. In reality, the score is likely to be a combination of pain intensity and pain affect, where pain affect describes the degree of emotional arousal or changes in action readiness caused by the sensory experience of pain. Factors such as social and work situation, setting, emotional state, anxiety levels, expectation, and prior pain experience may influence pain perception and show large inter-individual differences that may also be affected by time variables.  Patient instructions provided during this appointment: There are no Patient Instructions on file for this visit.

## 2016-07-30 ENCOUNTER — Ambulatory Visit: Payer: Medicaid Other | Admitting: Pain Medicine

## 2016-08-27 ENCOUNTER — Ambulatory Visit: Payer: Medicaid Other | Admitting: Pain Medicine

## 2016-08-27 ENCOUNTER — Encounter (INDEPENDENT_AMBULATORY_CARE_PROVIDER_SITE_OTHER): Payer: Self-pay

## 2016-08-27 NOTE — Progress Notes (Deleted)
Patient's Name: Brianna Moyer  MRN: [id number]  Referring Provider: Abran Richard, MD  DOB: January 12, 1971  PCP: Abran Richard, MD  DOS: 08/27/2016  Note by: Kathlen Brunswick. Dossie Arbour, MD  Service setting: Ambulatory outpatient  Specialty: Interventional Pain Management  Location: [facility] (AMB) Pain Management Facility    Patient type: New Patient   Primary Reason(s) for Visit: Initial Patient Evaluation CC: No chief complaint on file.  HPI  Ms. Banbury is a 46 y.o. year old, female patient, who comes today for an initial evaluation. She has Chronic pain syndrome; Long term current use of opiate analgesic; Long term prescription opiate use; Opiate use; Chronic bilateral low back pain with right-sided sciatica; Chronic pain of right lower extremity; Chronic radicular pain of lower extremity; and Chronic left sacroiliac pain on her problem list.. Her primarily concern today is the No chief complaint on file.  Pain Assessment: Self-Reported Pain Score:  /10             Reported level is compatible with observation.          Onset and Duration: {Hx; Onset and Duration:210120511} Cause of pain: {Hx; Cause:210120521} Severity: {Pain Severity:210120502} Timing: {Symptoms; Timing:210120501} Aggravating Factors: {Causes; Aggravating pain factors:210120507} Alleviating Factors: {Causes; Alleviating Factors:210120500} Associated Problems: {Hx; Associated problems:210120515} Quality of Pain: {Hx; Symptom quality or Descriptor:210120531} Previous Examinations or Tests: {Hx; Previous examinations or test:210120529} Previous Treatments: {Hx; Previous Treatment:210120503}  The patient comes into the clinics today for the first time for a chronic pain management evaluation. ***  Today I took the time to provide the patient with information regarding my pain practice. The patient was informed that my practice is divided into two sections: an interventional pain management section, as well as a completely separate and  distinct medication management section. The interventional portion of my practice takes place on Tuesdays and Thursdays, while the medication management is conducted on Mondays and Wednesdays. Because of the amount of documentation required on both them, they are kept separated. This means that there is the possibility that the patient may be scheduled for a procedure on Tuesday, while also having a medication management appointment on Wednesday. I have also informed the patient that because of current staffing and facility limitations, I no longer take patients for medication management only. To illustrate the reasons for this, I gave the patient the example of a surgeon and how inappropriate it would be to refer a patient to his/her practice so that they write for the post-procedure antibiotics on a surgery done by someone else.   The patient was informed that joining my practice means that they are open to any and all interventional therapies. I clarified for the patient that this does not mean that they will be forced to have any procedures done. What it means is that patients looking for a practitioner to simply write for their pain medications and not take advantage of other interventional techniques will be better served by a different practitioner, other than myself. I made it clear that I prefer to spend my time providing those services that I specialize in.  The patient was also made aware of my Comprehensive Pain Management Safety Guidelines where by joining my practice, they limit all of their nerve blocks and joint injections to those done by our practice, for as long as we are retained to manage their care.   Historic Controlled Substance Pharmacotherapy Review  PMP and historical list of controlled substances: *** Highest analgesic regimen found: *** Most recent analgesic: *** Highest  recorded MME/day: *** mg/day MME/day: *** mg/day Medications: The patient did not bring the medication(s)  to the appointment, as requested in our "New Patient Package" Pharmacodynamics: Desired effects: Analgesia: The patient reports >50% benefit. Reported improvement in function: The patient reports medication allows her to accomplish basic ADLs. Clinically meaningful improvement in function (CMIF): Sustained CMIF goals met Perceived effectiveness: Described as relatively effective, allowing for increase in activities of daily living (ADL) Undesirable effects: Side-effects or Adverse reactions: None reported Historical Monitoring: The patient  reports that she does not use drugs.. List of all UDS Test(s): No results found for: MDMA, COCAINSCRNUR, PCPSCRNUR, PCPQUANT, CANNABQUANT, THCU, [place] List of all Serum Drug Screening Test(s):  No results found for: AMPHSCRSER, BARBSCRSER, BENZOSCRSER, COCAINSCRSER, PCPSCRSER, PCPQUANT, THCSCRSER, CANNABQUANT, OPIATESCRSER, OXYSCRSER, PROPOXSCRSER Historical Background Evaluation: [place] PDMP: Six (6) year initial data search conducted.              Department of public safety, offender search: Editor, commissioning Information) Non-contributory Risk Assessment Profile: Aberrant behavior: None observed or detected today Risk factors for fatal opioid overdose: None identified today Fatal overdose hazard ratio (HR): Calculation deferred Non-fatal overdose hazard ratio (HR): Calculation deferred Risk of opioid abuse or dependence: 0.7-3.0% with doses ? 36 MME/day and 6.1-26% with doses ? 120 MME/day. Substance use disorder (SUD) risk level: Pending results of Medical Psychology Evaluation for SUD Opioid risk tool (ORT) (Total Score):    ORT Scoring interpretation table:  Score <3 = Low Risk for SUD  Score between 4-7 = Moderate Risk for SUD  Score >8 = High Risk for Opioid Abuse   PHQ-2 Depression Scale:  Total score:    PHQ-2 Scoring interpretation table: (Score and probability of major depressive disorder)  Score 0 = No depression  Score 1 = 15.4% Probability   Score 2 = 21.1% Probability  Score 3 = 38.4% Probability  Score 4 = 45.5% Probability  Score 5 = 56.4% Probability  Score 6 = 78.6% Probability   PHQ-9 Depression Scale:  Total score:    PHQ-9 Scoring interpretation table:  Score 0-4 = No depression  Score 5-9 = Mild depression  Score 10-14 = Moderate depression  Score 15-19 = Moderately severe depression  Score 20-27 = Severe depression (2.4 times higher risk of SUD and 2.89 times higher risk of overuse)   Pharmacologic Plan: Pending ordered tests and/or consults  Meds  The patient has a current medication list which includes the following prescription(s): cyclobenzaprine, diclofenac, duloxetine, hydrocodone-acetaminophen, metformin, oxycodone-acetaminophen, and sumatriptan succinate.  Current Outpatient Prescriptions on File Prior to Visit  Medication Sig  . cyclobenzaprine (FLEXERIL) 10 MG tablet Take 1 tablet (10 mg total) by mouth 3 (three) times daily as needed.  . diclofenac (VOLTAREN) 75 MG EC tablet Take 1 tablet (75 mg total) by mouth 2 (two) times daily. Take with food  . DULoxetine (CYMBALTA) 20 MG capsule Take 20 mg by mouth 2 (two) times daily.  Marland Kitchen HYDROcodone-acetaminophen (NORCO/VICODIN) 5-325 MG tablet Take 1-2 tablets by mouth every 4 (four) hours as needed.  . metFORMIN (GLUCOPHAGE) 500 MG tablet Take 500 mg by mouth 2 (two) times daily with a meal.  . oxyCODONE-acetaminophen (PERCOCET/ROXICET) 5-325 MG tablet Take 2 tablets by mouth every 4 (four) hours as needed for severe pain.  . SUMATRIPTAN SUCCINATE PO Take 1 tablet by mouth every 2 (two) hours as needed (migraines).   No current facility-administered medications on file prior to visit.    Imaging Review  Cervical Imaging: Cervical MR wo contrast: No  results found for this or any previous visit. Cervical MR wo contrast: No results found for this or any previous visit. Cervical MR w/wo contrast: No results found for this or any previous visit. Cervical MR  w contrast: No results found for this or any previous visit. Cervical CT wo contrast: No results found for this or any previous visit. Cervical CT w/wo contrast: No results found for this or any previous visit. Cervical CT w/wo contrast: No results found for this or any previous visit. Cervical CT w contrast: No results found for this or any previous visit. Cervical CT outside: No results found for this or any previous visit. Cervical DG 1 view: No results found for this or any previous visit. Cervical DG 2-3 views: No results found for this or any previous visit. Cervical DG F/E views: No results found for this or any previous visit. Cervical DG 2-3 clearing views: No results found for this or any previous visit. Cervical DG Bending/F/E views: No results found for this or any previous visit. Cervical DG complete: No results found for this or any previous visit. Cervical DG Myelogram views: No results found for this or any previous visit. Cervical DG Myelogram views: No results found for this or any previous visit. Cervical Discogram views: No results found for this or any previous visit.  Shoulder Imaging: Shoulder-R MR w contrast: No results found for this or any previous visit. Shoulder-L MR w contrast: No results found for this or any previous visit. Shoulder-R MR w/wo contrast: No results found for this or any previous visit. Shoulder-L MR w/wo contrast: No results found for this or any previous visit. Shoulder-R MR wo contrast: No results found for this or any previous visit. Shoulder-L MR wo contrast: No results found for this or any previous visit. Shoulder-R CT w contrast: No results found for this or any previous visit. Shoulder-L CT w contrast: No results found for this or any previous visit. Shoulder-R CT w/wo contrast: No results found for this or any previous visit. Shoulder-L CT w/wo contrast: No results found for this or any previous visit. Shoulder-R CT wo contrast: No  results found for this or any previous visit. Shoulder-L CT wo contrast: No results found for this or any previous visit. Shoulder-R DG Arthrogram: No results found for this or any previous visit. Shoulder-L DG Arthrogram: No results found for this or any previous visit. Shoulder-R DG 1 view: No results found for this or any previous visit. Shoulder-L DG 1 view: No results found for this or any previous visit. Shoulder-R DG: No results found for this or any previous visit. Shoulder-L DG: No results found for this or any previous visit.  Thoracic Imaging: Thoracic MR wo contrast: No results found for this or any previous visit. Thoracic MR wo contrast: No results found for this or any previous visit. Thoracic MR w/wo contrast: No results found for this or any previous visit. Thoracic MR w contrast: No results found for this or any previous visit. Thoracic CT wo contrast: No results found for this or any previous visit. Thoracic CT w/wo contrast: No results found for this or any previous visit. Thoracic CT w/wo contrast: No results found for this or any previous visit. Thoracic CT w contrast: No results found for this or any previous visit. Thoracic DG 2-3 views: No results found for this or any previous visit. Thoracic DG 4 views: No results found for this or any previous visit. Thoracic DG: No results found for this  or any previous visit. Thoracic DG w/swimmers view: No results found for this or any previous visit. Thoracic DG Myelogram views: No results found for this or any previous visit. Thoracic DG Myelogram views: No results found for this or any previous visit.  Lumbosacral Imaging: Lumbar MR wo contrast: No results found for this or any previous visit. Lumbar MR wo contrast: No results found for this or any previous visit. Lumbar MR w/wo contrast: No results found for this or any previous visit. Lumbar MR w contrast: No results found for this or any previous visit. Lumbar CT wo  contrast: No results found for this or any previous visit. Lumbar CT w/wo contrast: No results found for this or any previous visit. Lumbar CT w/wo contrast: No results found for this or any previous visit. Lumbar CT w contrast: No results found for this or any previous visit. Lumbar DG 1V: No results found for this or any previous visit. Lumbar DG 1V (Clearing): No results found for this or any previous visit. Lumbar DG 2-3V (Clearing): No results found for this or any previous visit. Lumbar DG 2-3 views: No results found for this or any previous visit. Lumbar DG (Complete) 4+V: No results found for this or any previous visit. Lumbar DG F/E views: No results found for this or any previous visit. Lumbar DG Bending views: No results found for this or any previous visit. Lumbar DG Myelogram views: No results found for this or any previous visit. Lumbar DG Myelogram: No results found for this or any previous visit. Lumbar DG Myelogram: No results found for this or any previous visit. Lumbar DG Myelogram: No results found for this or any previous visit. Lumbar DG Myelogram Lumbosacral: No results found for this or any previous visit. Lumbar DG Diskogram views: No results found for this or any previous visit. Lumbar DG Diskogram views: No results found for this or any previous visit. Lumbar DG Epidurogram OP: No results found for this or any previous visit. Lumbar DG Epidurogram IP: No results found for this or any previous visit.  Sacroiliac Joint Imaging: Sacroiliac Joint DG: No results found for this or any previous visit. Sacroiliac Joint MR w/wo contrast: No results found for this or any previous visit. Sacroiliac Joint MR wo contrast: No results found for this or any previous visit.  Spine Imaging: Whole Spine DG Myelogram views: No results found for this or any previous visit. Whole Spine MR Mets screen: No results found for this or any previous visit. Whole Spine MR Mets screen: No  results found for this or any previous visit. Whole Spine MR w/wo: No results found for this or any previous visit. MRA Spinal Canal w/ cm: No results found for this or any previous visit. MRA Spinal Canal wo/ cm: No results found for this or any previous visit. MRA Spinal Canal w/wo cm: No results found for this or any previous visit. Spine Outside MR Films: No results found for this or any previous visit. Spine Outside CT Films: No results found for this or any previous visit. CT-Guided Biopsy: No results found for this or any previous visit. CT-Guided Needle Placement: No results found for this or any previous visit. DG Spine outside: No results found for this or any previous visit. IR Spine outside: No results found for this or any previous visit. NM Spine outside: No results found for this or any previous visit.  Hip Imaging: Hip-R MR w contrast: No results found for this or any previous  visit. Hip-L MR w contrast: No results found for this or any previous visit. Hip-R MR w/wo contrast: No results found for this or any previous visit. Hip-L MR w/wo contrast: No results found for this or any previous visit. Hip-R MR wo contrast: No results found for this or any previous visit. Hip-L MR wo contrast: No results found for this or any previous visit. Hip-R CT w contrast: No results found for this or any previous visit. Hip-L CT w contrast: No results found for this or any previous visit. Hip-R CT w/wo contrast: No results found for this or any previous visit. Hip-L CT w/wo contrast: No results found for this or any previous visit. Hip-R CT wo contrast: No results found for this or any previous visit. Hip-L CT wo contrast: No results found for this or any previous visit. Hip-R DG 2-3 views: No results found for this or any previous visit. Hip-L DG 2-3 views: No results found for this or any previous visit. Hip-R DG Arthrogram: No results found for this or any previous visit. Hip-L DG  Arthrogram: No results found for this or any previous visit. Hip-B DG Bilateral: No results found for this or any previous visit.  Knee Imaging: Knee-R MR w contrast: No results found for this or any previous visit. Knee-L MR w contrast: No results found for this or any previous visit. Knee-R MR w/wo contrast: No results found for this or any previous visit. Knee-L MR w/wo contrast: No results found for this or any previous visit. Knee-R MR wo contrast: No results found for this or any previous visit. Knee-L MR wo contrast: No results found for this or any previous visit. Knee-R CT w contrast: No results found for this or any previous visit. Knee-L CT w contrast: No results found for this or any previous visit. Knee-R CT w/wo contrast: No results found for this or any previous visit. Knee-L CT w/wo contrast: No results found for this or any previous visit. Knee-R CT wo contrast: No results found for this or any previous visit. Knee-L CT wo contrast: No results found for this or any previous visit. Knee-R DG 1-2 views: No results found for this or any previous visit. Knee-L DG 1-2 views: No results found for this or any previous visit. Knee-R DG 3 views: No results found for this or any previous visit. Knee-L DG 3 views: No results found for this or any previous visit. Knee-R DG 4 views: No results found for this or any previous visit. Knee-L DG 4 views: No results found for this or any previous visit. Knee-R DG Arthrogram: No results found for this or any previous visit. Knee-L DG Arthrogram: No results found for this or any previous visit.  Note: Available results from prior imaging studies were reviewed.        ROS  Cardiovascular History: {Hx; Cardiovascular History:210120525} Pulmonary or Respiratory History: {Hx; Pumonary and/or Respiratory History:210120523} Neurological History: {Hx; Neurological:210120504} Review of Past Neurological Studies: No results found for this or any  previous visit. Psychological-Psychiatric History: {Hx; Psychological-Psychiatric History:210120512} Gastrointestinal History: {Hx; Gastrointestinal:210120527} Genitourinary History: {Hx; Genitourinary:210120506} Hematological History: {Hx; Hematological:210120510} Endocrine History: {Hx; Endocrine history:210120509} Rheumatologic History: {Hx; Rheumatological:210120530} Musculoskeletal History: {Hx; Musculoskeletal:210120528} Work History: {Hx; Work history:210120514}  Allergies  Ms. Musso is allergic to penicillins.  Laboratory Chemistry  Inflammation Markers No results found for: ESRSEDRATE, CRP Renal Function Markers No results found for: BUN, CREATININE, GFRAA, GFRNONAA Hepatic Function Markers No results found for: AST, ALT, ALBUMIN, ALKPHOS, HCVAB Electrolytes  No results found for: NA, K, CL, CALCIUM, MG Neuropathy Markers No results found for: VITAMINB12 Bone Pathology Markers No results found for: Hendricks Milo, VD125OH2TOT, [id number], [id number], 25OHVITD1, 25OHVITD2, 25OHVITD3, CALCIUM, TESTOFREE, TESTOSTERONE Coagulation Parameters No results found for: INR, LABPROT, APTT, PLT Cardiovascular Markers No results found for: BNP, HGB, HCT Note: Lab results reviewed.  [place]  Drug: Ms. Selders  reports that she does not use drugs. Alcohol:  reports that she does not drink alcohol. Tobacco:  reports that she has been smoking Cigarettes.  She has been smoking about 0.25 packs per day. She has never used smokeless tobacco. Medical:  has a past medical history of Back pain. Family: family history is not on file.  Past Surgical History:  Procedure Laterality Date  . FINGER SURGERY    . KNEE SURGERY    . TOE SURGERY     Active Ambulatory Problems    Diagnosis Date Noted  . Chronic pain syndrome 07/29/2016  . Long term current use of opiate analgesic 07/29/2016  . Long term prescription opiate use 07/29/2016  . Opiate use 07/29/2016  . Chronic bilateral low back  pain with right-sided sciatica 07/29/2016  . Chronic pain of right lower extremity 07/29/2016  . Chronic radicular pain of lower extremity 07/29/2016  . Chronic left sacroiliac pain 07/29/2016   Resolved Ambulatory Problems    Diagnosis Date Noted  . No Resolved Ambulatory Problems   Past Medical History:  Diagnosis Date  . Back pain    Constitutional Exam  General appearance: Well nourished, well developed, and well hydrated. In no apparent acute distress There were no vitals filed for this visit. BMI Assessment: Estimated body mass index is 32.49 kg/m as calculated from the following:   Height as of 01/30/16: _0  (1.753 m).   Weight as of 01/30/16: 220 lb (99.8 kg).  BMI interpretation table: BMI level Category Range association with higher incidence of chronic pain  <18 kg/m2 Underweight   18.5-24.9 kg/m2 Ideal body weight   25-29.9 kg/m2 Overweight Increased incidence by 20%  30-34.9 kg/m2 Obese (Class I) Increased incidence by 68%  35-39.9 kg/m2 Severe obesity (Class II) Increased incidence by 136%  >40 kg/m2 Extreme obesity (Class III) Increased incidence by 254%   BMI Readings from Last 4 Encounters:  01/30/16 32.49 kg/m  01/12/16 32.64 kg/m  12/31/15 31.90 kg/m  12/20/15 33.08 kg/m   Wt Readings from Last 4 Encounters:  01/30/16 220 lb (99.8 kg)  01/12/16 221 lb (100.2 kg)  12/31/15 216 lb (98 kg)  12/20/15 224 lb (101.6 kg)  Psych/Mental status: Alert, oriented x 3 (person, place, & time)       Eyes: PERLA Respiratory: No evidence of acute respiratory distress  Cervical Spine Exam  Inspection: No masses, redness, or swelling Alignment: Symmetrical Functional ROM: Unrestricted ROM Stability: No instability detected Muscle strength & Tone: Functionally intact Sensory: Unimpaired Palpation: Non-contributory  Upper Extremity (UE) Exam    Side: Right upper extremity  Side: Left upper extremity  Inspection: No masses, redness, swelling, or asymmetry. No  contractures  Inspection: No masses, redness, swelling, or asymmetry. No contractures  Functional ROM: Unrestricted ROM          Functional ROM: Unrestricted ROM          Muscle strength & Tone: Functionally intact  Muscle strength & Tone: Functionally intact  Sensory: Unimpaired  Sensory: Unimpaired  Palpation: Euthermic  Palpation: Euthermic  Specialized Test(s): Deferred         Specialized Test(s):  Deferred          Thoracic Spine Exam  Inspection: No masses, redness, or swelling Alignment: Symmetrical Functional ROM: Unrestricted ROM Stability: No instability detected Sensory: Unimpaired Muscle strength & Tone: Functionally intact Palpation: Non-contributory  Lumbar Spine Exam  Inspection: No masses, redness, or swelling Alignment: Symmetrical Functional ROM: Unrestricted ROM Stability: No instability detected Muscle strength & Tone: Functionally intact Sensory: Unimpaired Palpation: Non-contributory Provocative Tests: Lumbar Hyperextension and rotation test: evaluation deferred today       Patrick's Maneuver: evaluation deferred today              Gait & Posture Assessment  Ambulation: Unassisted Gait: Relatively normal for age and body habitus Posture: WNL   Lower Extremity Exam    Side: Right lower extremity  Side: Left lower extremity  Inspection: No masses, redness, swelling, or asymmetry. No contractures  Inspection: No masses, redness, swelling, or asymmetry. No contractures  Functional ROM: Unrestricted ROM          Functional ROM: Unrestricted ROM          Muscle strength & Tone: Functionally intact  Muscle strength & Tone: Functionally intact  Sensory: Unimpaired  Sensory: Unimpaired  Palpation: No palpable anomalies  Palpation: No palpable anomalies   Assessment  Primary Diagnosis & Pertinent Problem List: The primary encounter diagnosis was Chronic pain syndrome. Diagnoses of Long term prescription opiate use, Chronic bilateral low back pain with  right-sided sciatica, Chronic left sacroiliac pain, Chronic pain of right lower extremity, Chronic radicular pain of lower extremity, Long term current use of opiate analgesic, and Opiate use were also pertinent to this visit.  Visit Diagnosis: 1. Chronic pain syndrome   2. Long term prescription opiate use   3. Chronic bilateral low back pain with right-sided sciatica   4. Chronic left sacroiliac pain   5. Chronic pain of right lower extremity   6. Chronic radicular pain of lower extremity   7. Long term current use of opiate analgesic   8. Opiate use    Plan of Care  Initial treatment plan:  Please be advised that as per protocol, today's visit has been an evaluation only. We have not taken over the patient's controlled substance management.  Problem-specific plan: No problem-specific Assessment & Plan notes found for this encounter.  Ordered Lab-work, Procedure(s), Referral(s), & Consult(s): No orders of the defined types were placed in this encounter.  Pharmacotherapy: Medications ordered:  No orders of the defined types were placed in this encounter.  Medications administered during this visit: Ms. Budreau had no medications administered during this visit.   Pharmacotherapy under consideration:  Opioid Analgesics: The patient was informed that there is no guarantee that she would be a candidate for opioid analgesics. The decision will be made following CDC guidelines. This decision will be based on the results of diagnostic studies, as well as Ms. Homen's risk profile.  Membrane stabilizer: To be determined at a later time Muscle relaxant: To be determined at a later time NSAID: To be determined at a later time Other analgesic(s): To be determined at a later time   Interventional therapies under consideration: Ms. Menta was informed that there is no guarantee that she would be a candidate for interventional therapies. The decision will be based on the results of  diagnostic studies, as well as Ms. Laredo's risk profile.  Possible procedure(s): ***   Provider-requested follow-up: No Follow-up on file.  Future Appointments Date Time Provider [place]  08/27/2016 9:15 AM Beatriz Chancellor  Dossie Arbour, [place] None    Primary Care Physician: Abran Richard, MD Location: [facility] Note by: Kathlen Brunswick. Dossie Arbour, M.D, DABA, DABAPM, DABPM, DABIPP, FIPP Date: 08/27/2016; Time: 8:09 AM  Pain Score Disclaimer: We use the NRS-11 scale. This is a self-reported, subjective measurement of pain severity with only modest accuracy. It is used primarily to identify changes within a particular patient. It must be understood that outpatient pain scales are significantly less accurate that those used for research, where they can be applied under ideal controlled circumstances with minimal exposure to variables. In reality, the score is likely to be a combination of pain intensity and pain affect, where pain affect describes the degree of emotional arousal or changes in action readiness caused by the sensory experience of pain. Factors such as social and work situation, setting, emotional state, anxiety levels, expectation, and prior pain experience may influence pain perception and show large inter-individual differences that may also be affected by time variables.  Patient instructions provided during this appointment: There are no Patient Instructions on file for this visit.

## 2018-02-05 ENCOUNTER — Encounter (HOSPITAL_COMMUNITY): Payer: Self-pay | Admitting: Emergency Medicine

## 2018-02-05 ENCOUNTER — Other Ambulatory Visit: Payer: Self-pay

## 2018-02-05 ENCOUNTER — Emergency Department (HOSPITAL_COMMUNITY)
Admission: EM | Admit: 2018-02-05 | Discharge: 2018-02-05 | Disposition: A | Discharge status: Home / Self Care | Payer: Self-pay | Attending: Emergency Medicine | Admitting: Emergency Medicine

## 2018-02-05 DIAGNOSIS — S6412XS Injury of median nerve at wrist and hand level of left arm, sequela: Secondary | ICD-10-CM | POA: Insufficient documentation

## 2018-02-05 DIAGNOSIS — S5412XD Injury of median nerve at forearm level, left arm, subsequent encounter: Secondary | ICD-10-CM

## 2018-02-05 DIAGNOSIS — M79642 Pain in left hand: Secondary | ICD-10-CM | POA: Insufficient documentation

## 2018-02-05 DIAGNOSIS — F17210 Nicotine dependence, cigarettes, uncomplicated: Secondary | ICD-10-CM | POA: Insufficient documentation

## 2018-02-05 MED ORDER — HYDROCODONE-ACETAMINOPHEN 5-325 MG PO TABS: 1.0000 | ORAL_TABLET | ORAL | 0 refills | Status: DC | PRN

## 2018-02-05 MED ORDER — HYDROCODONE-ACETAMINOPHEN 5-325 MG PO TABS
1.0000 | ORAL_TABLET | Freq: Once | ORAL | Status: AC
  Administered 2018-02-05: 1 via ORAL
  Filled 2018-02-05: qty 1

## 2018-02-05 NOTE — Discharge Instructions (Addendum)
You have been given several referrals for followup care.  You may take the hydrocodone prescribed for pain relief.  This will make you drowsy - do not drive within 4 hours of taking this medication. Continue taking your gabapentin.

## 2018-02-05 NOTE — ED Notes (Signed)
Pt verbalized understanding of no driving and to use caution within 4 hours of taking pain meds due to meds cause drowsiness 
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
## 2018-05-01 ENCOUNTER — Encounter (HOSPITAL_COMMUNITY): Payer: Self-pay | Admitting: Emergency Medicine

## 2018-05-01 ENCOUNTER — Emergency Department (HOSPITAL_COMMUNITY)
Admission: EM | Admit: 2018-05-01 | Discharge: 2018-05-01 | Disposition: A | Discharge status: Home / Self Care | Payer: Self-pay | Attending: Emergency Medicine | Admitting: Emergency Medicine

## 2018-05-01 ENCOUNTER — Emergency Department (HOSPITAL_COMMUNITY): Payer: Self-pay

## 2018-05-01 ENCOUNTER — Other Ambulatory Visit: Payer: Self-pay

## 2018-05-01 DIAGNOSIS — S62663A Nondisplaced fracture of distal phalanx of left middle finger, initial encounter for closed fracture: Secondary | ICD-10-CM | POA: Insufficient documentation

## 2018-05-01 DIAGNOSIS — Y9389 Activity, other specified: Secondary | ICD-10-CM | POA: Insufficient documentation

## 2018-05-01 DIAGNOSIS — X500XXA Overexertion from strenuous movement or load, initial encounter: Secondary | ICD-10-CM | POA: Insufficient documentation

## 2018-05-01 DIAGNOSIS — Y999 Unspecified external cause status: Secondary | ICD-10-CM | POA: Insufficient documentation

## 2018-05-01 DIAGNOSIS — F17210 Nicotine dependence, cigarettes, uncomplicated: Secondary | ICD-10-CM | POA: Insufficient documentation

## 2018-05-01 DIAGNOSIS — Z7984 Long term (current) use of oral hypoglycemic drugs: Secondary | ICD-10-CM | POA: Insufficient documentation

## 2018-05-01 DIAGNOSIS — S161XXD Strain of muscle, fascia and tendon at neck level, subsequent encounter: Secondary | ICD-10-CM

## 2018-05-01 DIAGNOSIS — Z79899 Other long term (current) drug therapy: Secondary | ICD-10-CM | POA: Insufficient documentation

## 2018-05-01 DIAGNOSIS — M542 Cervicalgia: Secondary | ICD-10-CM | POA: Insufficient documentation

## 2018-05-01 DIAGNOSIS — Y929 Unspecified place or not applicable: Secondary | ICD-10-CM | POA: Insufficient documentation

## 2018-05-01 MED ORDER — HYDROCODONE-ACETAMINOPHEN 5-325 MG PO TABS
1.0000 | ORAL_TABLET | Freq: Once | ORAL | Status: AC
  Administered 2018-05-01: 1 via ORAL
  Filled 2018-05-01: qty 1

## 2018-05-01 MED ORDER — ONDANSETRON 8 MG PO TBDP
8.0000 mg | ORAL_TABLET | Freq: Once | ORAL | Status: AC
  Administered 2018-05-01: 8 mg via ORAL
  Filled 2018-05-01: qty 1

## 2018-05-01 MED ORDER — HYDROCODONE-ACETAMINOPHEN 7.5-325 MG PO TABS: 1.0000 | ORAL_TABLET | Freq: Four times a day (QID) | ORAL | 0 refills | Status: AC | PRN

## 2018-05-01 NOTE — Discharge Instructions (Addendum)
Elevate and apply ice packs on and off to your finger.  Keep your finger splinted.  Do not wear the cervical collar continuously.  Call Dr. Mort Sawyers office to arrange a follow-up appointment for next week.
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
